# Patient Record
Sex: Male | Born: 1990 | Hispanic: Yes | Marital: Single | Smoking: Never smoker
Health system: Southern US, Community
[De-identification: ages and names within clinical notes are randomized; demographics above are authoritative.]

## PROBLEM LIST (undated history)

## (undated) DIAGNOSIS — I6200 Nontraumatic subdural hemorrhage, unspecified: Secondary | ICD-10-CM

## (undated) HISTORY — PX: CRANIOTOMY: SHX93

---
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
## 2016-12-02 ENCOUNTER — Emergency Department: Payer: Self-pay

## 2016-12-02 ENCOUNTER — Encounter: Payer: Self-pay | Admitting: *Deleted

## 2016-12-02 ENCOUNTER — Emergency Department
Admission: EM | Admit: 2016-12-02 | Discharge: 2016-12-02 | Disposition: A | Discharge status: Home / Self Care | Payer: Self-pay | Attending: Student in an Organized Health Care Education/Training Program | Admitting: Student in an Organized Health Care Education/Training Program

## 2016-12-02 DIAGNOSIS — R202 Paresthesia of skin: Secondary | ICD-10-CM | POA: Insufficient documentation

## 2016-12-02 DIAGNOSIS — Z9889 Other specified postprocedural states: Secondary | ICD-10-CM | POA: Insufficient documentation

## 2016-12-02 NOTE — ED Triage Notes (Signed)
Pt is requesting a head ct scan.  Pt had subdural hematoma October 06 2016 .  Pt has pressure in top of head and feels numbness in teeth .   Pt alert   Speech clear.  Pt ambulates without diff.

## 2016-12-02 NOTE — ED Provider Notes (Signed)
[facility] Emergency Department Provider Note    First MD Initiated Contact with Patient 12/02/16 2158     (approximate)  I have reviewed the triage vital signs and the nursing notes.   HISTORY  Chief Complaint Headache    HPI Brad Leach is a 26 y.o. male with a history of subdural hematoma status post craniotomy presents with several days of intermittent tingling in his face and mild headache. No fevers. No trauma. 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 Denies any numbness or tingling at this time. Has none of the tingling sensation that brought him to the ER. Denies any headache at the time. Denies any chest pain or shortness of breath. Is not on any anticoagulation.   No past medical history on file. No family history on file. No past surgical history on file. There are no active problems to display for this patient.     Prior to Admission medications   Not on File    Allergies Tylenol [acetaminophen]    Social History Social History  Substance Use Topics  . Smoking status: Never Smoker  . Smokeless tobacco: Never Used  . Alcohol use No    Review of Systems Patient denies headaches, rhinorrhea, blurry vision, numbness, shortness of breath, chest pain, edema, cough, abdominal pain, nausea, vomiting, diarrhea, dysuria, fevers, rashes or hallucinations unless otherwise stated above in HPI. ____________________________________________   PHYSICAL EXAM:  VITAL SIGNS: Vitals:   12/02/16 2105  BP: 129/67  Pulse: 72  Resp: 20  Temp: 99.2 F (37.3 C)  SpO2: 100%    Constitutional: Alert and oriented. Well appearing and in no acute distress. Eyes: Conjunctivae are normal.  Head: Atraumatic. Nose: No congestion/rhinnorhea. Mouth/Throat: Mucous membranes are moist.   Neck: No stridor. Painless ROM.  Cardiovascular: Normal rate, regular rhythm. Grossly normal  heart sounds.  Good peripheral circulation. Respiratory: Normal respiratory effort.  No retractions. Lungs CTAB. Gastrointestinal: Soft and nontender. No distention. No abdominal bruits. No CVA tenderness. Genitourinary:  Musculoskeletal: No lower extremity tenderness nor edema.  No joint effusions. Neurologic:  CN- intact.  No facial droop, Normal FNF.  Normal heel to shin.  Sensation intact bilaterally. Normal speech and language. No gross focal neurologic deficits are appreciated. No gait instability. Skin:  Skin is warm, dry and intact. No rash noted. Psychiatric: Mood and affect are normal. Speech and behavior are normal.  ____________________________________________   LABS (all labs ordered are listed, but only abnormal results are displayed)  No results found for this or any previous visit (from the past 24 hour(s)). ____________________________________________  ____________________________________________  RADIOLOGY  I personally reviewed all radiographic images ordered to evaluate for the above acute complaints and reviewed radiology reports and findings.  These findings were personally discussed with the patient.  Please see medical record for radiology report.  ____________________________________________   PROCEDURES  Procedure(s) performed:  Procedures    Critical Care performed: no ____________________________________________   INITIAL IMPRESSION / ASSESSMENT AND PLAN / ED COURSE  Pertinent labs & imaging results that were available during my care of the patient were reviewed by me and considered in my medical decision making (see chart for details).  DDX: sdh, iph, dehydration, migraine, tension  Brad Leach is a 26 y.o. who presents to the ED with above symptoms. Patient is completely asymptomatic at this time. CT imaging shows no evidence of mass effect or acute intraparenchymal hemorrhage. Symptoms more  consistent with postop findings as the patient  states that his most recent [place] in [place] had 17 mm.  is not on any blood thinners. His neuro exam is completely intact at this time I do feel the patient is appropriate for referral to neurosurgery at our facility. Do not feel that there is any indication for operative management at this time. Discussed signs and symptoms for which the patient should return immediately to the hospital.  Have discussed with the patient and available family all diagnostics and treatments performed thus far and all questions were answered to the best of my ability. The patient demonstrates understanding and agreement with plan.       ____________________________________________   FINAL CLINICAL IMPRESSION(S) / ED DIAGNOSES  Final diagnoses:  Tingling of face  Status post craniotomy      NEW MEDICATIONS STARTED DURING THIS VISIT:  New Prescriptions   No medications on file     Note:  This document was prepared using Dragon voice recognition software and may include unintentional dictation errors.    Willy Eddyobinson, Sumayya Muha, MD 12/02/16 [phone]

## 2016-12-02 NOTE — ED Notes (Signed)

## 2016-12-02 NOTE — Discharge Instructions (Signed)

## 2017-02-14 ENCOUNTER — Emergency Department
Admission: EM | Admit: 2017-02-14 | Discharge: 2017-02-14 | Disposition: A | Discharge status: Home / Self Care | Payer: Self-pay | Attending: Emergency Medicine | Admitting: Emergency Medicine

## 2017-02-14 ENCOUNTER — Emergency Department: Payer: Self-pay

## 2017-02-14 ENCOUNTER — Encounter: Payer: Self-pay | Admitting: Emergency Medicine

## 2017-02-14 DIAGNOSIS — R519 Headache, unspecified: Secondary | ICD-10-CM

## 2017-02-14 DIAGNOSIS — R51 Headache: Secondary | ICD-10-CM | POA: Insufficient documentation

## 2017-02-14 HISTORY — DX: Nontraumatic subdural hemorrhage, unspecified: I62.00

## 2017-02-14 NOTE — ED Notes (Addendum)
Pt to room from CT, ED Provider at bedside.  Pt reports pain at left occipital surgical site, pt also reporting jerky movements for the last week, "brain surgery" 4 months ago.  Pt denies further neurological deficits.

## 2017-02-14 NOTE — ED Provider Notes (Addendum)
[facility] Emergency Department Provider Note  ____________________________________________   I have reviewed the triage vital signs and the nursing notes.   HISTORY  Chief Complaint Headache    HPI Brad Leach is a 26 y.o. 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  Patient has had surgery on his brain x2 for this most [facility] [place]  Patient states that since that surgery he has had an uncomfortable area around the left incision which persist.  Denies any headache he states that just uncomfortable where they cut his head open.  This pain has been there on a daily basis now for 5 months.  [place] but has yet to establish care with neurosurgery.  He denies any seizure activity, numbness weakness actual headaches, he denies any fever or chills.  Head trauma.   Past Medical History:  Diagnosis Date  . Subdural hemorrhage (HCC)     There are no active problems to display for this patient.   Past Surgical History:  Procedure Laterality Date  . CRANIOTOMY      Prior to Admission medications   Not on File    Allergies Tylenol [acetaminophen]  No family history on file.  Social History Social History   Tobacco Use  . Smoking status: Never Smoker  . Smokeless tobacco: Never Used  Substance Use Topics  . Alcohol use: No  . Drug use: No    Review of Systems Constitutional: No fever/chills Eyes: No visual changes. ENT: No sore throat. No stiff neck no neck pain Cardiovascular: Denies chest pain. Respiratory: Denies shortness of breath. Gastrointestinal:   no vomiting.  No diarrhea.  No constipation. Genitourinary: Negative for dysuria. Musculoskeletal: Negative lower extremity swelling Skin: Negative for rash. Neurological: Negative for severe  headaches, focal weakness or numbness.   ____________________________________________   PHYSICAL EXAM:  VITAL SIGNS: ED Triage Vitals  Enc Vitals Group     BP 02/14/17 1834 (!) 143/81     Pulse Rate 02/14/17 1834 75     Resp 02/14/17 1834 16     Temp 02/14/17 1834 98.5 F (36.9 C)     Temp Source 02/14/17 1834 Oral     SpO2 02/14/17 1834 99 %     Weight 02/14/17 1836 200 lb (90.7 kg)     Height 02/14/17 1836 5\' 7"  (1.702 m)     Head Circumference --      Peak Flow --      Pain Score 02/14/17 1839 5     Pain Loc --      Pain Edu? --      Excl. in GC? --     Constitutional: Alert and oriented. Well appearing and in no acute distress. Eyes: Conjunctivae are normal Head: Atraumatic multiple different incisions noted, principally one on the left and one on the right, they are very slightly tender they are not red they are not swollen they are not erythematous, they are not warm to touch, there is no obvious lesion, there is no swelling or evidence of CSF leak, very normal-appearing scars HEENT: No congestion/rhinnorhea. Mucous membranes are moist.  Oropharynx non-erythematous Neck:   Nontender with no meningismus, no masses, no stridor Cardiovascular: Normal rate, regular rhythm. Grossly normal heart sounds.  Good peripheral circulation. Respiratory: Normal respiratory effort.  No retractions. Lungs CTAB. Abdominal: Soft and nontender. No distention. No guarding no rebound Back:  There is no focal tenderness or step off.  there is no midline tenderness there are no lesions noted. there is no CVA tenderness Musculoskeletal: No lower extremity tenderness, no upper extremity tenderness. No joint effusions, no DVT signs strong distal pulses no edema Neurologic: Cranial nerves II through XII are grossly intact 5 out of 5 strength bilateral upper and lower extremity. Finger to nose within normal limits heel to shin within normal limits, speech is normal with no word finding difficulty or  dysarthria, reflexes symmetric, pupils are equally round and reactive to light, there is no pronator drift, sensation is normal, vision is intact to confrontation, gait is deferred, there is no nystagmus, normal neurologic exam Skin:  Skin is warm, dry and intact. No rash noted. Psychiatric: Mood and affect are normal. Speech and behavior are normal.  ____________________________________________   LABS (all labs ordered are listed, but only abnormal results are displayed)  Labs Reviewed - No data to display  Pertinent labs  results that were available during my care of the patient were reviewed by me and considered in my medical decision making (see chart for details). ____________________________________________  EKG  I personally interpreted any EKGs ordered by me or triage Sinus rhythm, rate 87 bpm no acute ST elevation or depression, normal axis, possible LAE ____________________________________________  RADIOLOGY  Pertinent labs & imaging results that were available during my care of the patient were reviewed by me and considered in my medical decision making (see chart for details). If possible, patient and/or family made aware of any abnormal findings.  No results found. ____________________________________________    PROCEDURES  Procedure(s) performed: None  Procedures  Critical Care performed: None  ____________________________________________   INITIAL IMPRESSION / ASSESSMENT AND PLAN / ED COURSE  Pertinent labs & imaging results that were available during my care of the patient were reviewed by me and considered in my medical decision making (see chart for details).  Patient is here because he is concerned that he has had pain around his incisions for 4 months.  Very reassuring exam however given his extensive history I did do a CT scan and it is reassuring.  I have reassured the patient he has outpatient follow-up with a primary care doctor, [facility],  tomorrow to establish care.  We will also refer to neurosurgery here although at this time I see no evidence of acute pathology.  Patient is very reassured by her findings.  He states he was just anxious that something "might be wrong".  Extensive return precautions and follow-up given and understood.    ____________________________________________   FINAL CLINICAL IMPRESSION(S) / ED DIAGNOSES  Final diagnoses:  None      This chart was dictated using voice recognition software.  Despite best efforts to proofread,  errors can occur which can change meaning.      Jeanmarie PlantMcShane, James A, MD 02/14/17 2005    Jeanmarie PlantMcShane, James A, MD 02/14/17 2013    Jeanmarie PlantMcShane, James A, MD 02/14/17 2017

## 2017-02-14 NOTE — ED Triage Notes (Addendum)
Pt in via POV with complaints of headache x one week and left side jaw pain, denies any chest pain, denies any shortness of breath.  Pt reports hx of craniotomy due to subdural bleed in July.  Pt A/Ox4, vitals WDL, NAD noted at this time.

## 2017-02-14 NOTE — ED Notes (Signed)
Verbal report to Noel, RN 

## 2017-02-14 NOTE — Discharge Instructions (Signed)
If you have any new or worrisome symptoms including change in chronic pain, fever, vomiting, seizure activity or other concerns including numbness or weakness please return, otherwise follow closely with PCP and neurosurgery

## 2017-02-14 NOTE — ED Notes (Signed)
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

## 2017-02-24 ENCOUNTER — Ambulatory Visit: Payer: Self-pay | Admitting: Family Medicine

## 2017-03-01 ENCOUNTER — Other Ambulatory Visit: Payer: Self-pay | Admitting: Neurosurgery

## 2017-03-01 DIAGNOSIS — G930 Cerebral cysts: Secondary | ICD-10-CM

## 2017-03-08 ENCOUNTER — Ambulatory Visit: Payer: Self-pay

## 2017-03-11 ENCOUNTER — Ambulatory Visit
Admission: RE | Admit: 2017-03-11 | Discharge: 2017-03-11 | Disposition: A | Discharge status: Home / Self Care | Payer: Self-pay | Source: Ambulatory Visit | Attending: Neurosurgery | Admitting: Neurosurgery

## 2017-03-11 DIAGNOSIS — G960 Cerebrospinal fluid leak: Secondary | ICD-10-CM | POA: Insufficient documentation

## 2017-03-11 DIAGNOSIS — G930 Cerebral cysts: Secondary | ICD-10-CM | POA: Insufficient documentation

## 2017-03-11 MED ORDER — GADOBENATE DIMEGLUMINE 529 MG/ML IV SOLN
20.0000 mL | Freq: Once | INTRAVENOUS | Status: AC | PRN
  Administered 2017-03-11: 19 mL via INTRAVENOUS

## 2018-11-27 IMAGING — CT CT HEAD W/O CM
3 series · 15 of 47 positions shown, 18 images · non-contrast
Comparison: 12/02/2016 CT head

CLINICAL DATA: 26 y/o  M; 1 week with severe headache.

EXAM:
CT HEAD WITHOUT CONTRAST
TECHNIQUE: Contiguous axial images were obtained from the base of the skull
through the vertex without intravenous contrast.

[Series 2: head wo · axial · 0.47mm/px · z∈[-176,-41]mm · 9 of 33 slices shown, 12 images]
[im 3/33  brain]
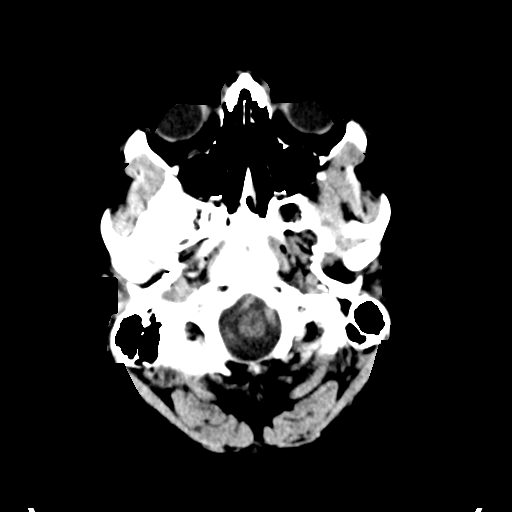
[im 3/33  bone]
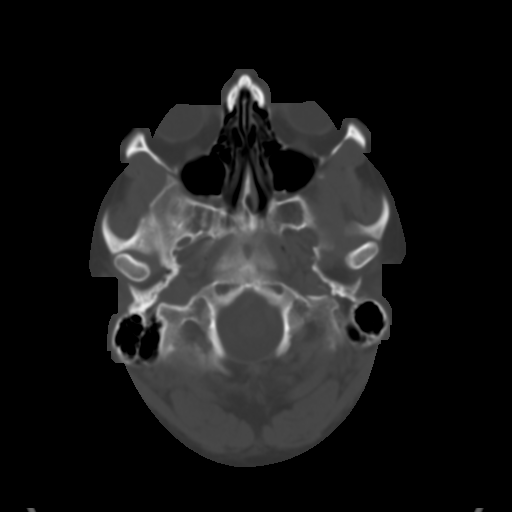
[im 6/33  brain]
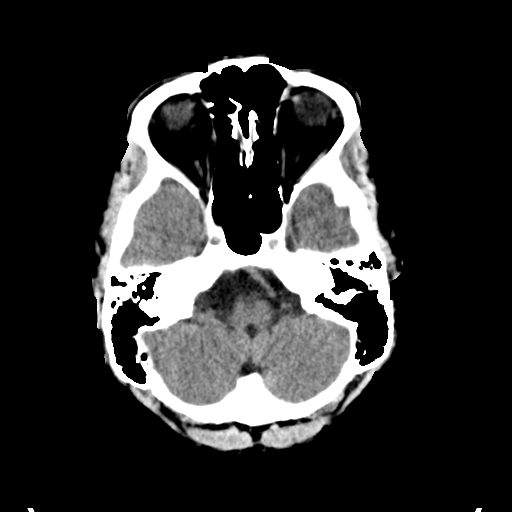
[im 9/33  brain]
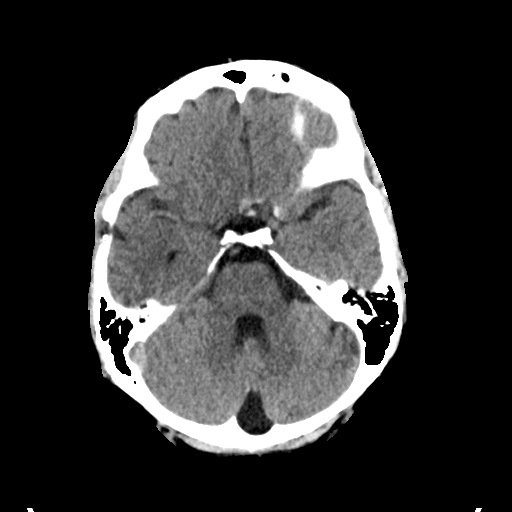
[im 13/33  brain]
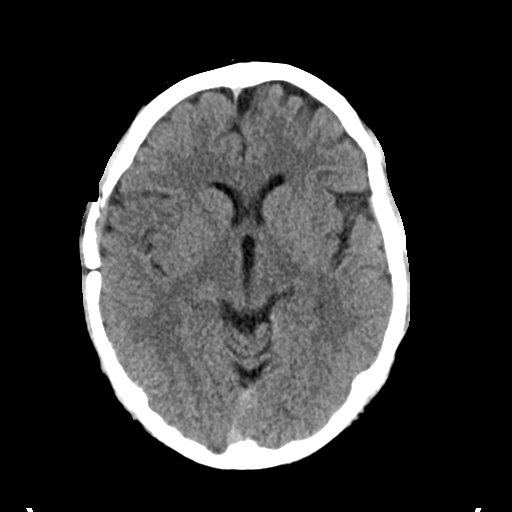
[im 17/33  brain]
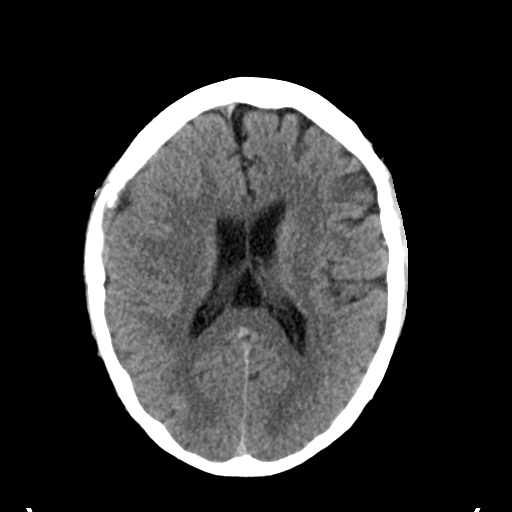
[im 17/33  bone]
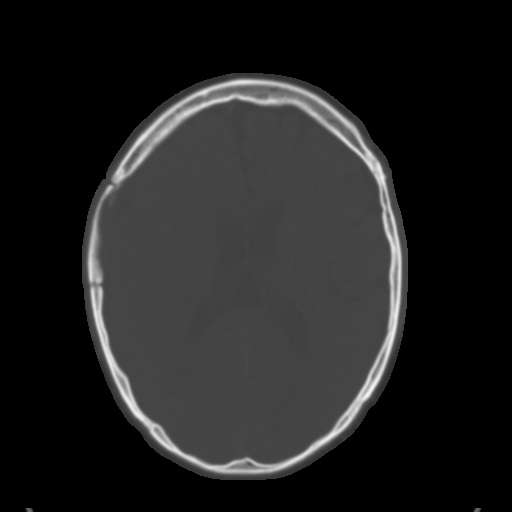
[im 20/33  brain]
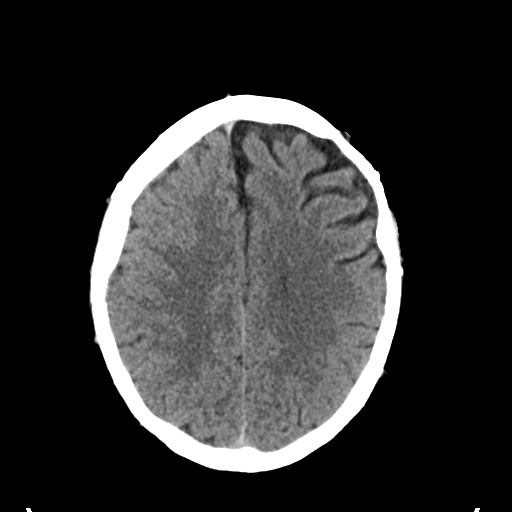
[im 24/33  brain]
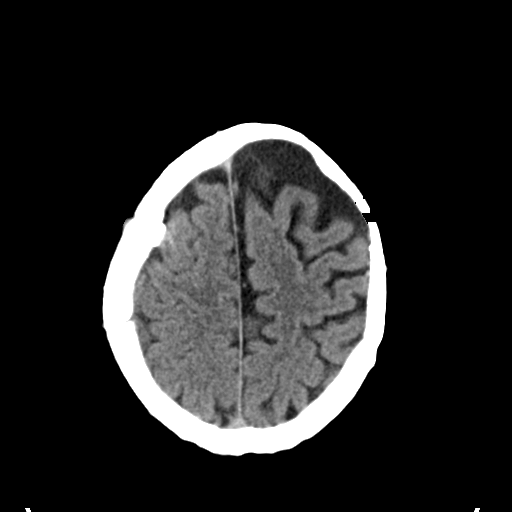
[im 27/33  brain]
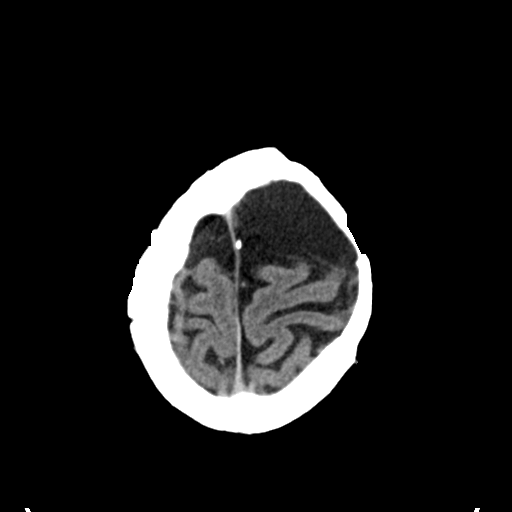
[im 30/33  brain]
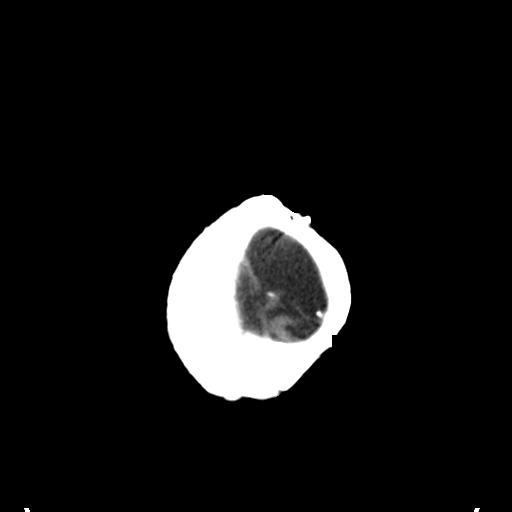
[im 30/33  bone]
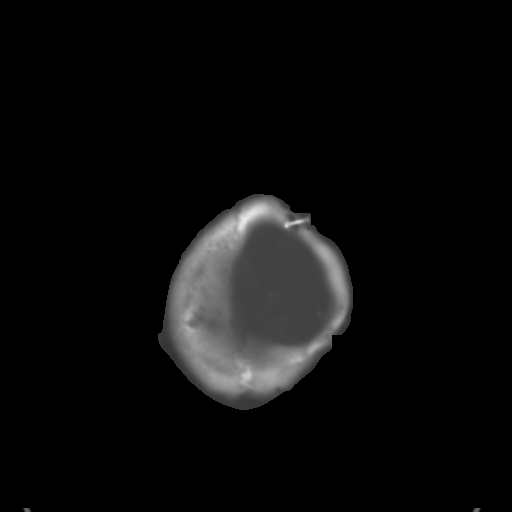

[Series 4: coronal soft tissue · coronal · 0.31mm/px · 3 of 69 slices shown]
[im 23/69  brain]
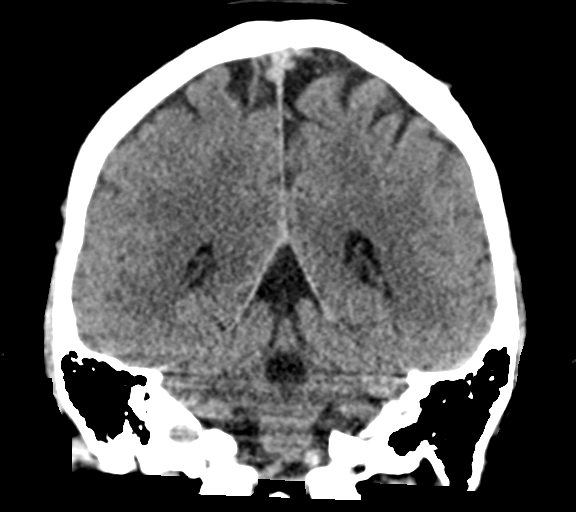
[im 31/69  brain]
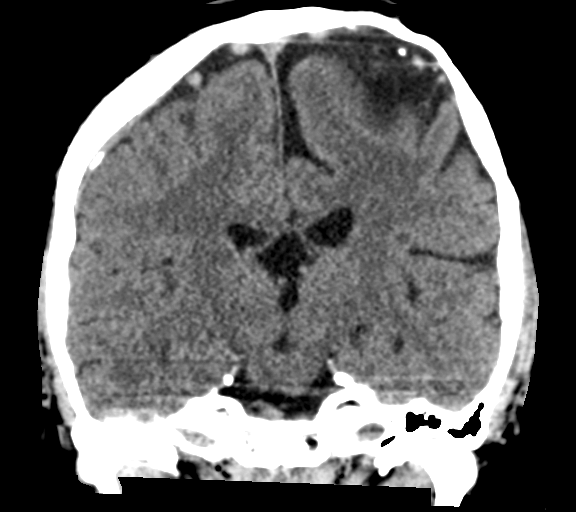
[im 38/69  brain]
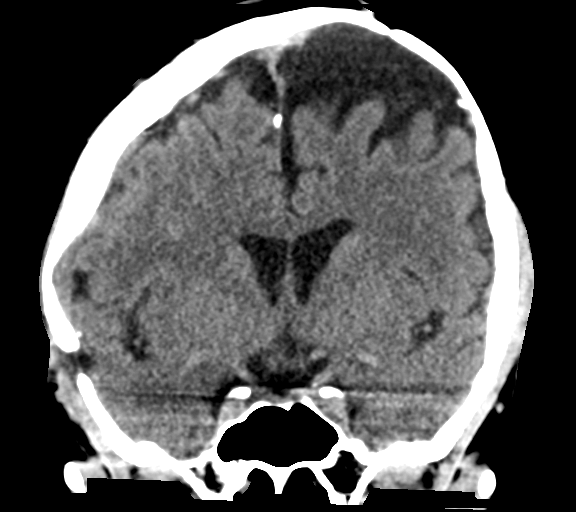

[Series 5: sagittal soft tissue · sagittal · 0.32mm/px · 3 of 58 slices shown]
[im 20/58  brain]
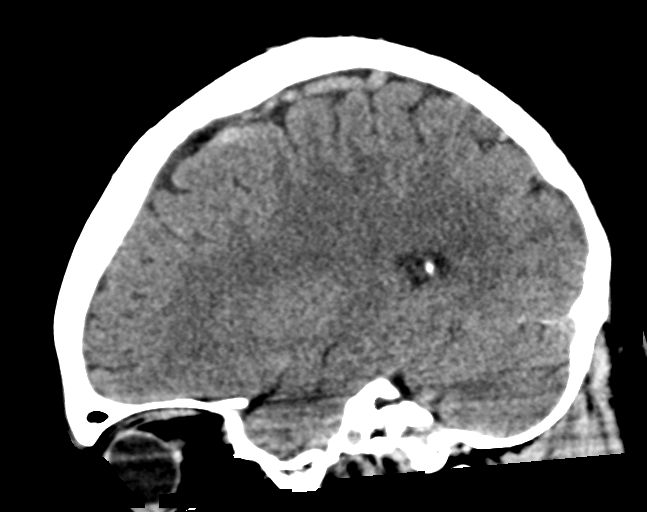
[im 29/58  brain]
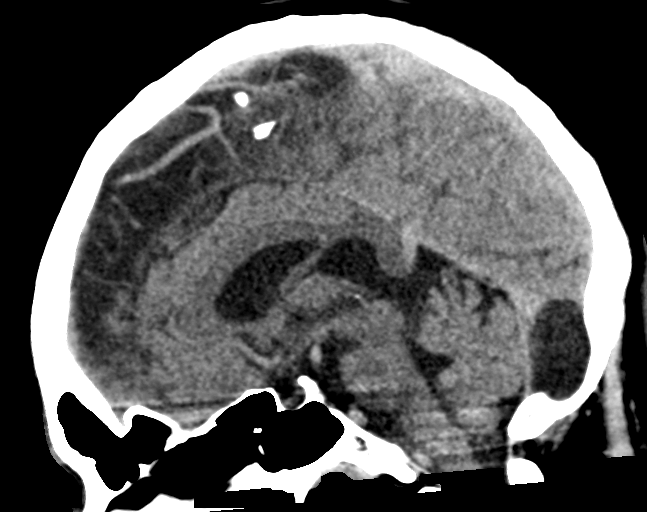
[im 39/58  brain]
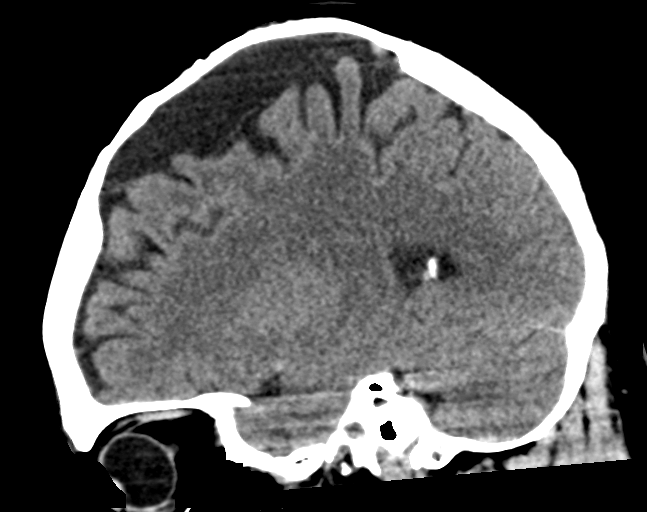

[15 of 47 positions shown; findings below may reference images not displayed]

FINDINGS: Brain: No large acute infarct, acute intracranial hemorrhage, or
focal mass effect of the brain. No hydrocephalus or herniation.
Stable chronic calcified dural thickening subjacent to right
craniotomy. CSF attenuation prominent extra-axial space subjacent to
the left sided craniotomy, probably chronic hygroma.

Vascular: No hyperdense vessel or unexpected calcification.

Skull: Bilateral chronic craniotomy postsurgical changes.

Sinuses/Orbits: No acute finding.

Other: None.
IMPRESSION: 1. No acute intracranial abnormality.
2. Chronic postsurgical changes related to bilateral craniotomy with
stable subjacent calcified right-sided dural thickening and small
left frontal convexity hygroma.

By: Najy Tiger M.D.
